# Patient Record
Sex: Female | Born: 1985 | Race: White | Hispanic: No | Marital: Single | Smoking: Current every day smoker
Health system: Southern US, Community
[De-identification: ages and names within clinical notes are randomized; demographics above are authoritative.]

## PROBLEM LIST (undated history)

## (undated) ENCOUNTER — Ambulatory Visit (HOSPITAL_COMMUNITY): Discharge status: Absent without leave | Payer: Medicaid Other

## (undated) DIAGNOSIS — F909 Attention-deficit hyperactivity disorder, unspecified type: Secondary | ICD-10-CM

## (undated) HISTORY — PX: NO PAST SURGERIES: SHX2092

## (undated) HISTORY — DX: Attention-deficit hyperactivity disorder, unspecified type: F90.9

---
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
## 2007-01-04 ENCOUNTER — Inpatient Hospital Stay (HOSPITAL_COMMUNITY): Admission: AD | Admit: 2007-01-04 | Discharge: 2007-01-04 | Payer: Self-pay | Admitting: Obstetrics and Gynecology

## 2007-01-05 ENCOUNTER — Inpatient Hospital Stay (HOSPITAL_COMMUNITY): Admission: RE | Admit: 2007-01-05 | Discharge: 2007-01-07 | Payer: Self-pay | Admitting: Obstetrics and Gynecology

## 2007-02-17 ENCOUNTER — Other Ambulatory Visit: Admission: RE | Admit: 2007-02-17 | Discharge: 2007-02-17 | Payer: Self-pay | Admitting: Obstetrics and Gynecology

## 2007-09-05 ENCOUNTER — Emergency Department (HOSPITAL_COMMUNITY): Admission: EM | Admit: 2007-09-05 | Discharge: 2007-09-05 | Payer: Self-pay | Admitting: Emergency Medicine

## 2008-09-26 ENCOUNTER — Other Ambulatory Visit: Admission: RE | Admit: 2008-09-26 | Discharge: 2008-09-26 | Payer: Self-pay | Admitting: Obstetrics and Gynecology

## 2009-01-08 ENCOUNTER — Emergency Department (HOSPITAL_BASED_OUTPATIENT_CLINIC_OR_DEPARTMENT_OTHER): Admission: EM | Admit: 2009-01-08 | Discharge: 2009-01-08 | Payer: Self-pay | Admitting: Emergency Medicine

## 2009-01-08 ENCOUNTER — Ambulatory Visit: Payer: Self-pay | Admitting: Diagnostic Radiology

## 2009-04-07 IMAGING — CR DG CHEST 1V PORT
1 series · 1 of 1 positions shown · non-contrast
Comparison: No prior studies.

CLINICAL DATA: Backache, fever, cough

PORTABLE CHEST - 1 VIEW

[view not recorded]
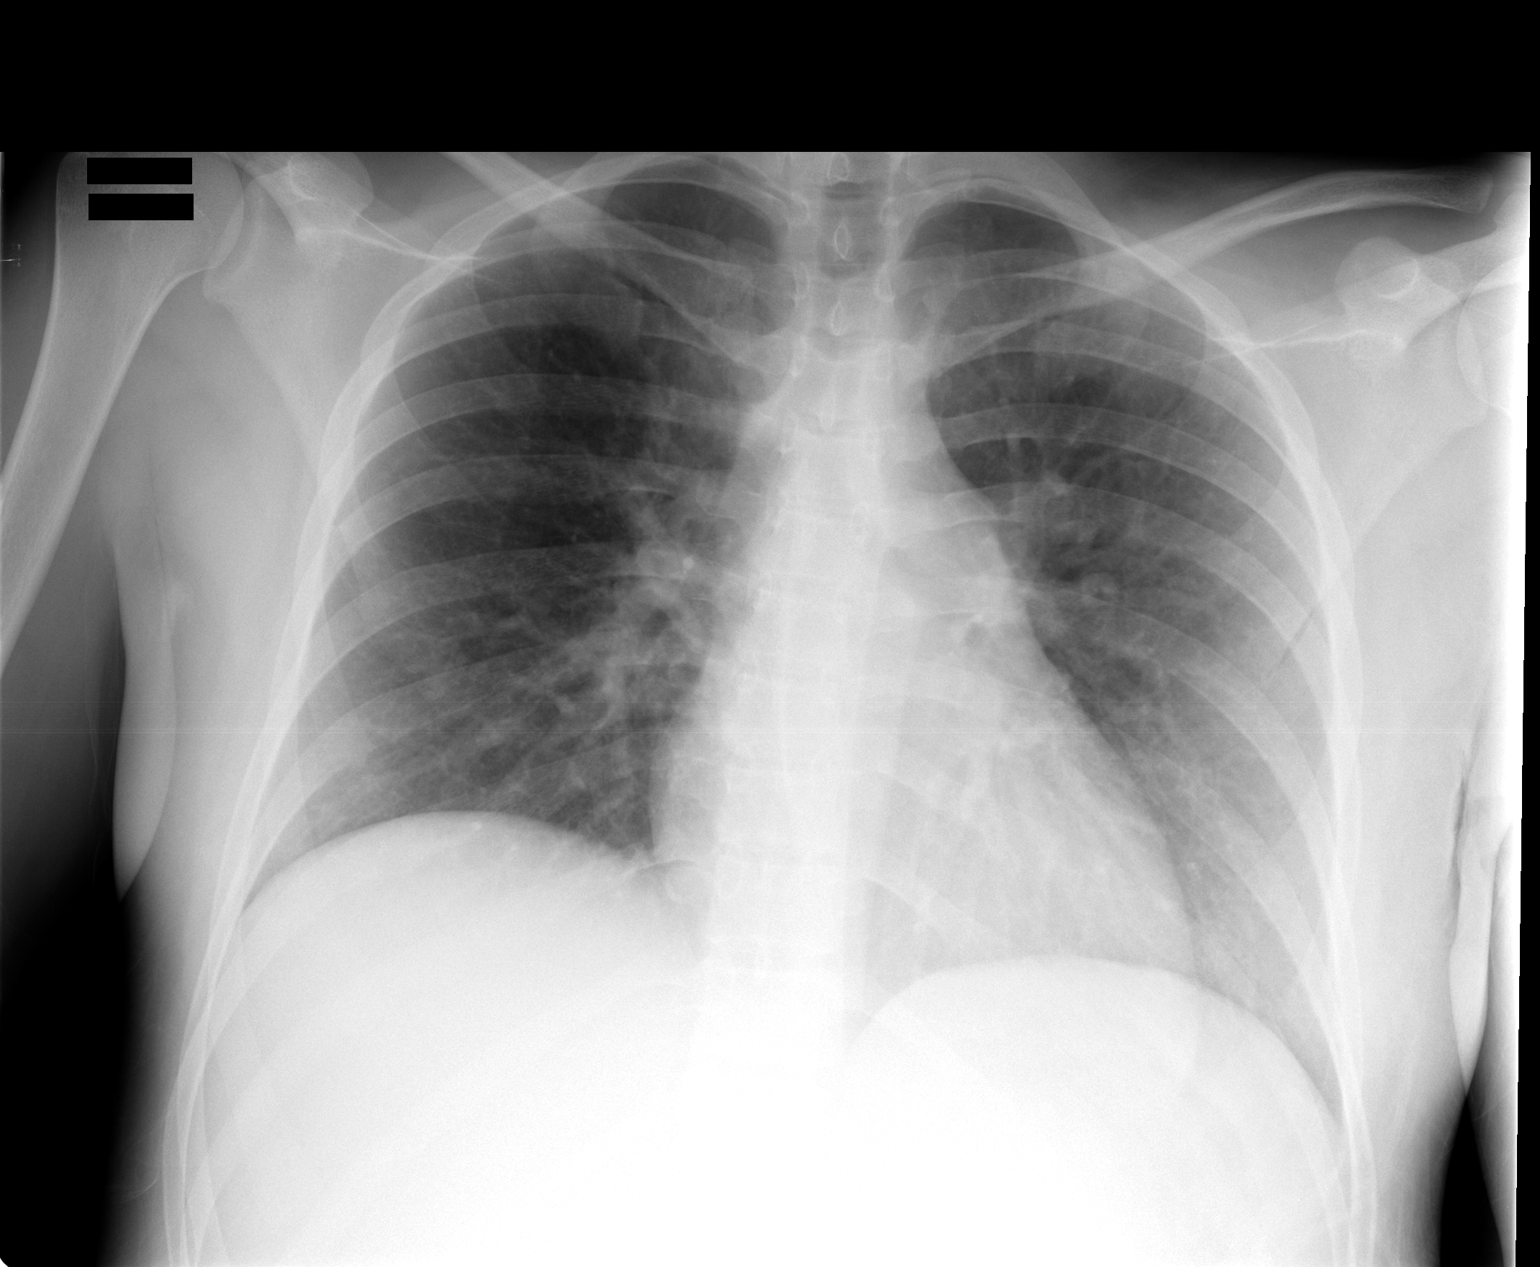

[1 of 1 positions shown; findings below may reference images not displayed]

FINDINGS: There are no acute infiltrates.  The heart and
mediastinal structures are normal for portable study.
IMPRESSION: No evidence for active chest disease.

## 2009-07-05 ENCOUNTER — Emergency Department (HOSPITAL_BASED_OUTPATIENT_CLINIC_OR_DEPARTMENT_OTHER): Admission: EM | Admit: 2009-07-05 | Discharge: 2009-07-05 | Payer: Self-pay | Admitting: Emergency Medicine

## 2009-07-05 ENCOUNTER — Ambulatory Visit: Payer: Self-pay | Admitting: Interventional Radiology

## 2010-06-07 ENCOUNTER — Encounter: Payer: Self-pay | Admitting: Obstetrics and Gynecology

## 2010-08-11 IMAGING — CR DG ABDOMEN ACUTE W/ 1V CHEST
4 series · 4 of 4 positions shown · non-contrast
Comparison: Chest radiograph performed 09/05/2007

CLINICAL DATA: Abdominal pain and vomiting.

ACUTE ABDOMEN SERIES (ABDOMEN 2 VIEW & CHEST 1 VIEW)

[w chest pa]
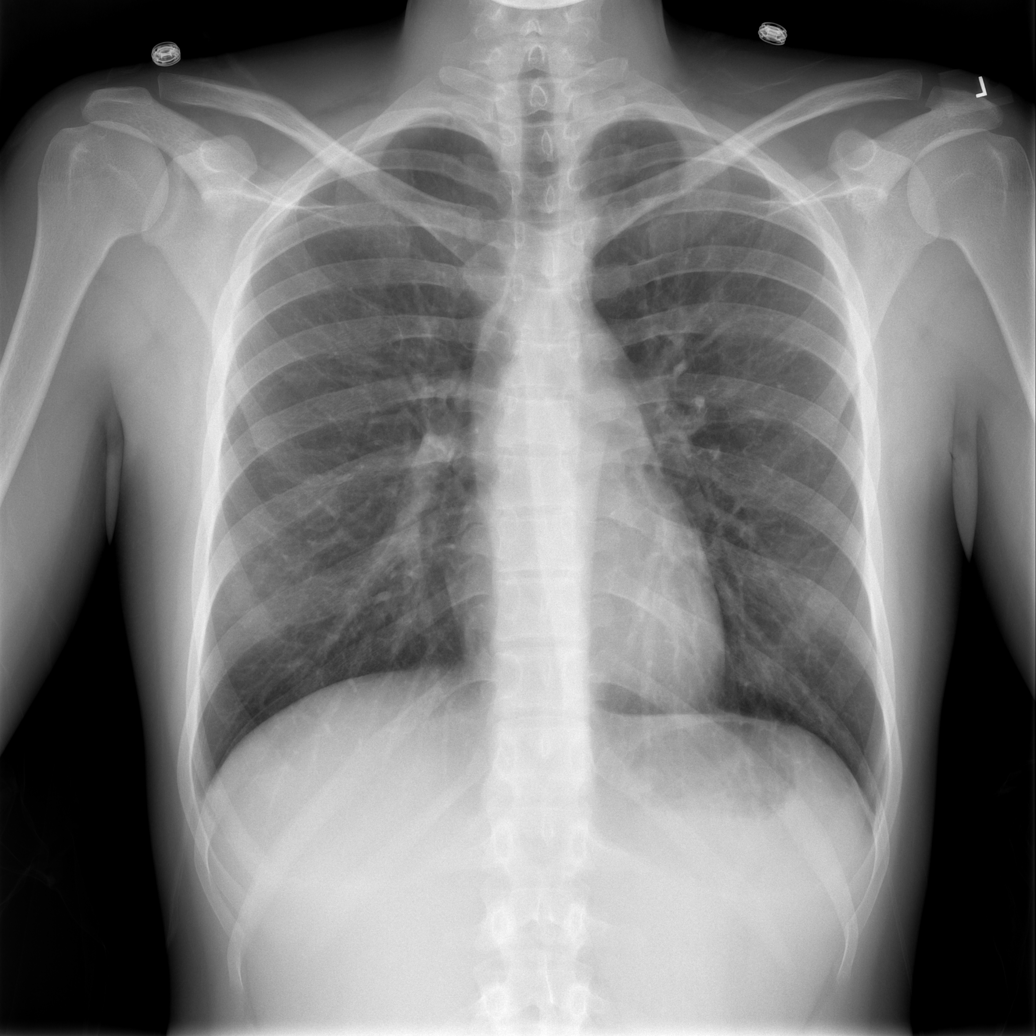

[w abdomen upright]
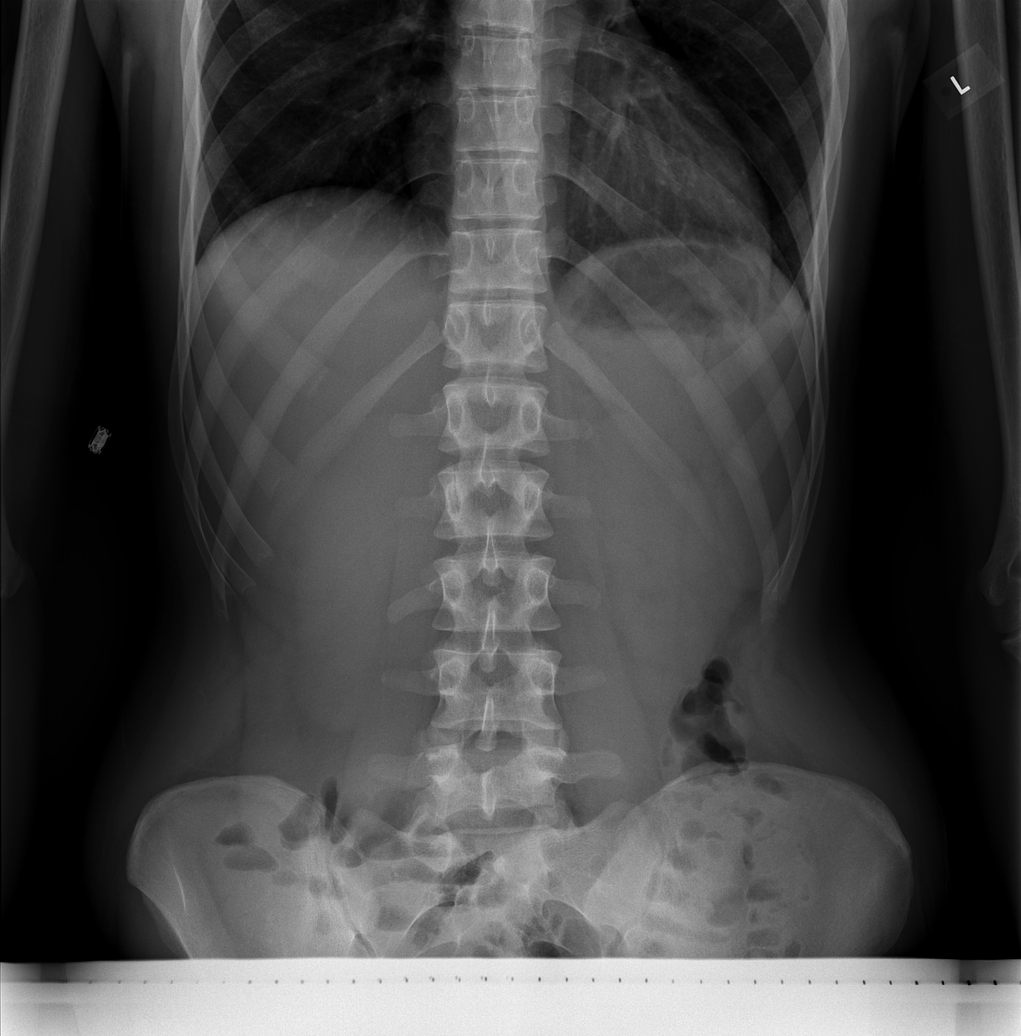

[t abdomen supine (1 of 2)]
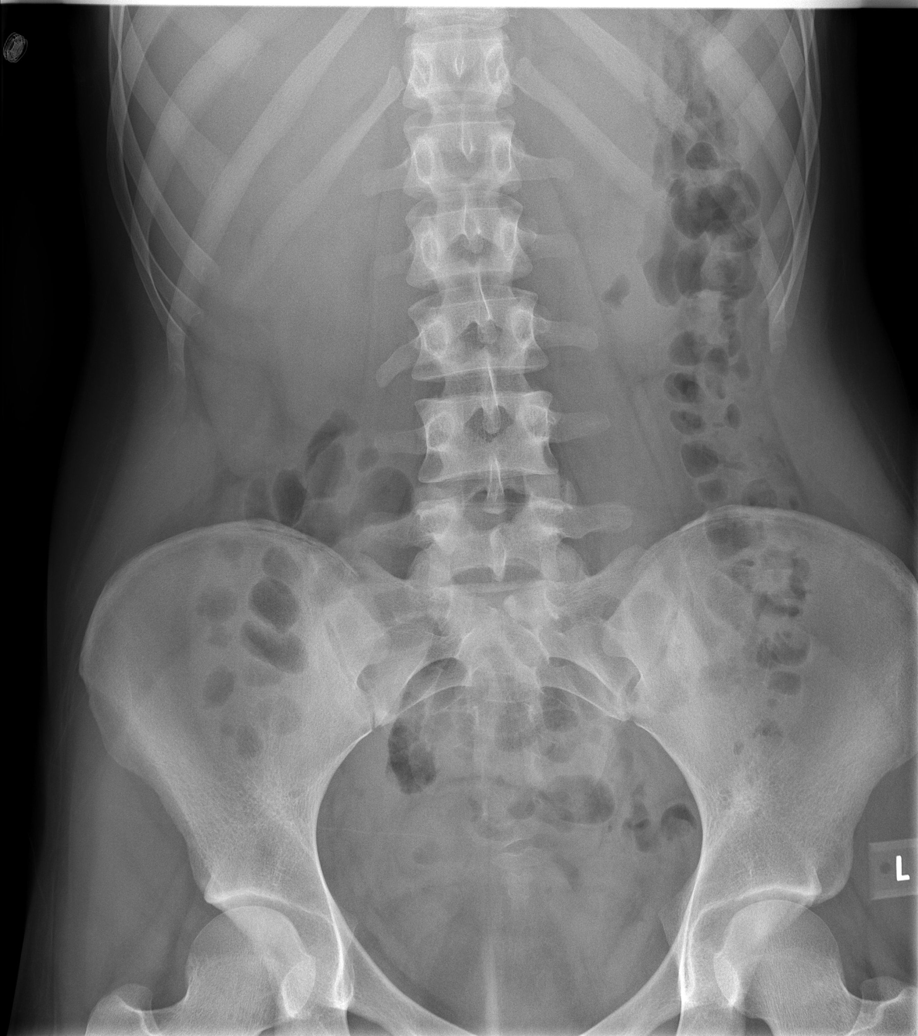

[t abdomen supine (2 of 2)]
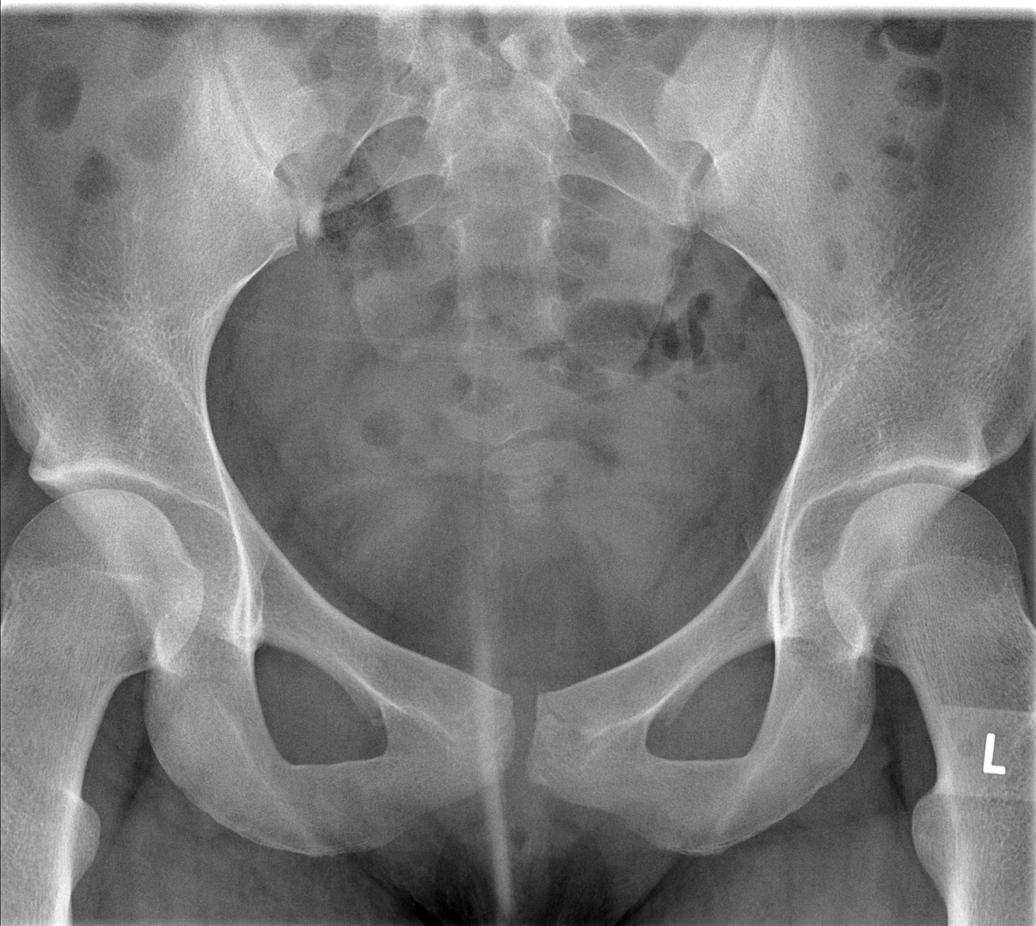

[4 of 4 positions shown; findings below may reference images not displayed]

FINDINGS: The lungs are well-aerated and clear.  There is no
evidence of focal opacification, pleural effusion or pneumothorax.
The cardiomediastinal silhouette is within normal limits.

The visualized bowel gas pattern is unremarkable. Stool and air are
noted within the colon; there is no evidence of small bowel
dilatation to suggest obstruction.  No free intra-abdominal air is
identified on the provided upright view.

No acute osseous abnormalities are seen; the sacroiliac joints are
unremarkable in appearance.
IMPRESSION: No acute cardiopulmonary process seen; unremarkable bowel gas
pattern.  No free intra-abdominal air identified.

## 2010-08-22 LAB — DIFFERENTIAL
Basophils Absolute: 0.1 10*3/uL (ref 0.0–0.1)
Basophils Relative: 2 % — ABNORMAL HIGH (ref 0–1)
Eosinophils Absolute: 0.1 10*3/uL (ref 0.0–0.7)
Lymphocytes Relative: 31 % (ref 12–46)
Monocytes Absolute: 1 10*3/uL (ref 0.1–1.0)

## 2010-08-22 LAB — URINE MICROSCOPIC-ADD ON

## 2010-08-22 LAB — CBC
HCT: 38.3 % (ref 36.0–46.0)
MCHC: 34.7 g/dL (ref 30.0–36.0)
Platelets: 260 10*3/uL (ref 150–400)
RBC: 4.08 MIL/uL (ref 3.87–5.11)
RDW: 12.7 % (ref 11.5–15.5)

## 2010-08-22 LAB — LIPASE, BLOOD: Lipase: 15 U/L — ABNORMAL LOW (ref 23–300)

## 2010-08-22 LAB — COMPREHENSIVE METABOLIC PANEL
Albumin: 4.7 g/dL (ref 3.5–5.2)
GFR calc Af Amer: >60 mL/min (ref >60)

## 2010-08-22 LAB — URINALYSIS, ROUTINE W REFLEX MICROSCOPIC
Nitrite: NEGATIVE
Specific Gravity, Urine: 1.024 (ref 1.005–1.030)
Urobilinogen, UA: 0.2 mg/dL (ref 0.0–1.0)
pH: 6 (ref 5.0–8.0)

## 2010-08-22 LAB — RPR: RPR Ser Ql: NONREACTIVE

## 2010-08-22 LAB — GC/CHLAMYDIA PROBE AMP, GENITAL: GC Probe Amp, Genital: POSITIVE — AB

## 2010-08-22 LAB — WET PREP, GENITAL: Trich, Wet Prep: NONE SEEN

## 2010-10-02 NOTE — Discharge Summary (Signed)
NAME:  Sharon Morgan, Sharon Morgan              ACCOUNT NO.:  [id number]   MEDICAL RECORD NO.:  [id number]          PATIENT TYPE:  INP   LOCATION:  9107                          FACILITY:  [facility]   PHYSICIAN:  James A. Ashley Royalty, M.D.DATE OF BIRTH:  Dec 27, 1985   DATE OF ADMISSION:  01/05/2007  DATE OF DISCHARGE:  01/07/2007                               DISCHARGE SUMMARY   DISCHARGE DIAGNOSES:  1. Intrauterine pregnancy at term, delivered.  2. Term birth living child, vertex.   OPERATIONS AND SPECIAL PROCEDURES:  OB delivery, episiotomy,  episiorrhaphy.   CONSULTATIONS:  None.   DISCHARGE MEDICATIONS:  1. Percocet.  2. Motrin 600 mg.   HISTORY AND PHYSICAL:  This is an 25 year old primigravida at 31 weeks,  6 days gestation.  Prenatal care was essentially uncomplicated.  The  patient presented complaining of contractions.  Cervical examination at  or about 9:20 a.m. revealed 5 cm dilatation, 100% effacement, +1  station, vertex presentation.  For the remainder of the history and  physical, please see chart.   HOSPITAL COURSE:  The patient was admitted to [facility]  [place].  Initial laboratory studies were drawn.  Artificial rupture  of membranes was accomplished which revealed blood-tinged fluid.  The  patient went on to labor and deliver on January 05, 2007.  The infant was  a 6 pound 8 ounce female, Apgars 9 and one minutes, 9 at five minutes.  Sent to the newborn nursery.  Delivery was accomplished by Dr. Sylvester Harder over a midline episiotomy which was repaired without  difficulty.  The patient's postpartum course was benign.  She was  discharged on the second postpartum day, afebrile and in satisfactory  condition.   DISPOSITION:  The patient is to return to [facility] gynecology and  obstetrics in 4-6 weeks for postpartum evaluation.      James A. Ashley Royalty, M.D.  Electronically Signed     JAM/MEDQ  D:  02/02/2007  T:  02/02/2007  Job:  [id number]

## 2011-02-09 LAB — BASIC METABOLIC PANEL
CO2: 23
Calcium: 9
Creatinine, Ser: 0.91
GFR calc non Af Amer: >60

## 2011-02-09 LAB — PREGNANCY, URINE: Preg Test, Ur: NEGATIVE

## 2011-02-09 LAB — CSF CELL COUNT WITH DIFFERENTIAL
RBC Count, CSF: 2 — ABNORMAL HIGH
Tube #: 4
WBC, CSF: 2
WBC, CSF: 2

## 2011-02-09 LAB — URINALYSIS, ROUTINE W REFLEX MICROSCOPIC: pH: 5.5

## 2011-02-09 LAB — CBC
Hemoglobin: 11.1 — ABNORMAL LOW
Platelets: 208
RBC: 3.41 — ABNORMAL LOW
RDW: 12.3

## 2011-02-09 LAB — URINE MICROSCOPIC-ADD ON

## 2011-02-09 LAB — GLUCOSE, CSF: Glucose, CSF: 65

## 2011-02-09 LAB — CSF CULTURE W GRAM STAIN

## 2011-02-26 LAB — CBC
HCT: 31.8 — ABNORMAL LOW
Hemoglobin: 10.9 — ABNORMAL LOW
Hemoglobin: 11.1 — ABNORMAL LOW
MCHC: 34.8
MCHC: 35
MCHC: 35.5
MCV: 97.4
MCV: 99.2 — ABNORMAL HIGH
Platelets: 201
Platelets: 218
Platelets: 271
RBC: 3.82
RDW: 14
RDW: 14
RDW: 14.5 — ABNORMAL HIGH

## 2011-02-26 LAB — TYPE AND SCREEN: ABO/RH(D): O POS

## 2011-02-26 LAB — RPR: RPR Ser Ql: NONREACTIVE

## 2013-03-27 ENCOUNTER — Encounter (HOSPITAL_COMMUNITY): Payer: Self-pay

## 2013-03-27 ENCOUNTER — Inpatient Hospital Stay (HOSPITAL_COMMUNITY)
Admission: AD | Admit: 2013-03-27 | Discharge: 2013-03-27 | Disposition: A | Discharge status: Home / Self Care | Payer: Medicaid Other | Source: Ambulatory Visit | Attending: Obstetrics & Gynecology | Admitting: Obstetrics & Gynecology

## 2013-03-27 DIAGNOSIS — N10 Acute tubulo-interstitial nephritis: Secondary | ICD-10-CM

## 2013-03-27 DIAGNOSIS — R300 Dysuria: Secondary | ICD-10-CM | POA: Insufficient documentation

## 2013-03-27 DIAGNOSIS — R109 Unspecified abdominal pain: Secondary | ICD-10-CM | POA: Insufficient documentation

## 2013-03-27 LAB — CBC
HCT: 34.6 % — ABNORMAL LOW (ref 36.0–46.0)
Hemoglobin: 11.9 g/dL — ABNORMAL LOW (ref 12.0–15.0)
MCHC: 34.4 g/dL (ref 30.0–36.0)
MCV: 93 fL (ref 78.0–100.0)
WBC: 12.5 10*3/uL — ABNORMAL HIGH (ref 4.0–10.5)

## 2013-03-27 LAB — URINE MICROSCOPIC-ADD ON

## 2013-03-27 LAB — POCT PREGNANCY, URINE: Preg Test, Ur: NEGATIVE

## 2013-03-27 LAB — URINALYSIS, ROUTINE W REFLEX MICROSCOPIC
Nitrite: NEGATIVE
Protein, ur: NEGATIVE mg/dL
Specific Gravity, Urine: 1.02 (ref 1.005–1.030)
Urobilinogen, UA: 0.2 mg/dL (ref 0.0–1.0)

## 2013-03-27 MED ORDER — CIPROFLOXACIN HCL 500 MG PO TABS
500.0000 mg | ORAL_TABLET | Freq: Once | ORAL | Status: AC
  Administered 2013-03-27: 500 mg via ORAL
  Filled 2013-03-27: qty 1

## 2013-03-27 MED ORDER — TRAMADOL HCL 50 MG PO TABS: 50.0000 mg | ORAL_TABLET | Freq: Four times a day (QID) | ORAL | Status: DC | PRN

## 2013-03-27 MED ORDER — PHENAZOPYRIDINE HCL 100 MG PO TABS
200.0000 mg | ORAL_TABLET | Freq: Once | ORAL | Status: AC
  Administered 2013-03-27: 200 mg via ORAL
  Filled 2013-03-27: qty 2

## 2013-03-27 MED ORDER — TRAMADOL HCL 50 MG PO TABS
50.0000 mg | ORAL_TABLET | Freq: Once | ORAL | Status: AC
  Administered 2013-03-27: 50 mg via ORAL
  Filled 2013-03-27: qty 1

## 2013-03-27 MED ORDER — PHENAZOPYRIDINE HCL 200 MG PO TABS: 200.0000 mg | ORAL_TABLET | Freq: Three times a day (TID) | ORAL | Status: DC | PRN

## 2013-03-27 MED ORDER — CIPROFLOXACIN HCL 500 MG PO TABS: 500.0000 mg | ORAL_TABLET | Freq: Two times a day (BID) | ORAL | Status: DC

## 2013-03-27 NOTE — MAU Provider Note (Signed)
Chief Complaint: Back Pain   First Provider Initiated Contact with Patient 03/27/13 0620      SUBJECTIVE HPI: Sharon Morgan is a 27 y.o. G54P1001 female who presents with left flank pain, dysuria, frequency and urgency of urination x2 days. Gradual onset of flank pain over past 2 days.  States she thought she had a UTI. Increased water intake and take Tylenol for pain with no relief of symptoms.  Unsure if she has a fever, but started sweating a lot about 2 hours after taking Tylenol yesterday evening.  Has not had any testing done since symptoms started.  History reviewed. No pertinent past medical history. OB History  Gravida Para Term Preterm AB SAB TAB Ectopic Multiple Living  1 1 1       1     # Outcome Date GA Lbr Len/2nd Weight Sex Delivery Anes PTL Lv  1 TRM 2008     SVD   Y     Past Surgical History  Procedure Laterality Date  . No past surgeries     History   Social History  . Marital Status: Single    Spouse Name: N/A    Number of Children: N/A  . Years of Education: N/A   Occupational History  . Not on file.   Social History Main Topics  . Smoking status: Current Every Day Smoker  . Smokeless tobacco: Never Used  . Alcohol Use: No  . Drug Use: No  . Sexual Activity: Yes    Birth Control/ Protection: Pill   Other Topics Concern  . Not on file   Social History Narrative  . No narrative on file   No current facility-administered medications on file prior to encounter.   No current outpatient prescriptions on file prior to encounter.   No Known Allergies  ROS: Pertinent positive items in HPI. Negative for chills, nausea, vomiting, diarrhea, constipation, hematuria, abdominal pain, vaginal bleeding or vaginal discharge.  OBJECTIVE Blood pressure 118/71, pulse 94, temperature 99.5 F (37.5 C), temperature source Oral, resp. rate 18, height 5\' 7"  (1.702 m), weight 76.839 kg (169 lb 6.4 oz), last menstrual period 03/10/2013. GENERAL: Well-developed,  well-nourished female in mild distress.  HEENT: Normocephalic HEART: normal rate RESP: normal effort ABDOMEN: Soft, non-tender. Positive left CVA tenderness. EXTREMITIES: Nontender, no edema NEURO: Alert and oriented SPECULUM EXAM: Deferred  LAB RESULTS Results for orders placed during the hospital encounter of 03/27/13 (from the past 24 hour(s))  URINALYSIS, ROUTINE W REFLEX MICROSCOPIC     Status: Abnormal   Collection Time    03/27/13  5:26 AM      Result Value Range   Color, Urine YELLOW  YELLOW   APPearance CLEAR  CLEAR   Specific Gravity, Urine 1.020  1.005 - 1.030   pH 6.0  5.0 - 8.0   Glucose, UA NEGATIVE  NEGATIVE mg/dL   Hgb urine dipstick SMALL (*) NEGATIVE   Bilirubin Urine NEGATIVE  NEGATIVE   Ketones, ur 40 (*) NEGATIVE mg/dL   Protein, ur NEGATIVE  NEGATIVE mg/dL   Urobilinogen, UA 0.2  0.0 - 1.0 mg/dL   Nitrite NEGATIVE  NEGATIVE   Leukocytes, UA SMALL (*) NEGATIVE  URINE MICROSCOPIC-ADD ON     Status: None   Collection Time    03/27/13  5:26 AM      Result Value Range   Squamous Epithelial / LPF RARE  RARE   WBC, UA 3-6  <3 WBC/hpf   RBC / HPF 0-2  <3 RBC/hpf  Bacteria, UA RARE  RARE  POCT PREGNANCY, URINE     Status: None   Collection Time    03/27/13  5:34 AM      Result Value Range   Preg Test, Ur NEGATIVE  NEGATIVE  CBC     Status: Abnormal   Collection Time    03/27/13  6:22 AM      Result Value Range   WBC 12.5 (*) 4.0 - 10.5 K/uL   RBC 3.72 (*) 3.87 - 5.11 MIL/uL   Hemoglobin 11.9 (*) 12.0 - 15.0 g/dL   HCT 16.1 (*) 09.6 - 04.5 %   MCV 93.0  78.0 - 100.0 fL   MCH 32.0  26.0 - 34.0 pg   MCHC 34.4  30.0 - 36.0 g/dL   RDW 40.9  81.1 - 91.4 %   Platelets 194  150 - 400 K/uL    IMAGING No results found.  MAU COURSE CBC, Cipro, Pyridium and tramadol ordered.  ASSESSMENT 1. Acute pyelonephritis    PLAN Discharge home in stable condition. Increase fluids and rest. Work note Given. Urine culture pending.  informed patient that she  would need hospitalization for IV antibiotics if unable to keep down oral medications or symptoms do not improve with outpatient treatment.   Follow-up Information   Follow up with Primary care provider In 1 week.      Follow up with [facility]. (As needed if symptoms worsen or if unable to keep down medications)    Contact information:   [address] [place] [address]        Medication List         ciprofloxacin 500 MG tablet  Commonly known as:  CIPRO  Take 1 tablet (500 mg total) by mouth 2 (two) times daily.     ibuprofen 800 MG tablet  Commonly known as:  ADVIL,MOTRIN  Take 800 mg by mouth every 8 (eight) hours as needed.     norethindrone-ethinyl estradiol 0.5-35 MG-MCG tablet  Commonly known as:  NECON,BREVICON,MODICON  Take 1 tablet by mouth daily.     phenazopyridine 200 MG tablet  Commonly known as:  PYRIDIUM  Take 1 tablet (200 mg total) by mouth 3 (three) times daily as needed for pain.     traMADol 50 MG tablet  Commonly known as:  ULTRAM  Take 1-2 tablets (50-100 mg total) by mouth every 6 (six) hours as needed.       [place], CNM 03/27/2013  6:02 AM

## 2013-03-27 NOTE — MAU Note (Signed)
Constant left mid back pain since 8pm. Denies vaginal bleeding or discharge. Denies n/v/d. Burning with urination x 2 days.

## 2013-03-29 LAB — URINE CULTURE: Culture: 25000

## 2013-09-19 ENCOUNTER — Ambulatory Visit (INDEPENDENT_AMBULATORY_CARE_PROVIDER_SITE_OTHER): Payer: Medicaid Other | Admitting: Obstetrics

## 2013-09-19 ENCOUNTER — Encounter: Payer: Self-pay | Admitting: Obstetrics

## 2013-09-19 VITALS — BP 122/65 | HR 75 | Temp 98.8°F | Ht 69.0 in | Wt 168.0 lb

## 2013-09-19 DIAGNOSIS — Z0000 Encounter for general adult medical examination without abnormal findings: Secondary | ICD-10-CM

## 2013-09-19 DIAGNOSIS — N939 Abnormal uterine and vaginal bleeding, unspecified: Secondary | ICD-10-CM

## 2013-09-19 DIAGNOSIS — Z3041 Encounter for surveillance of contraceptive pills: Secondary | ICD-10-CM

## 2013-09-19 DIAGNOSIS — N926 Irregular menstruation, unspecified: Secondary | ICD-10-CM

## 2013-09-19 LAB — POCT URINALYSIS DIPSTICK
Blood, UA: 250
GLUCOSE UA: NEGATIVE
KETONES UA: NEGATIVE
LEUKOCYTES UA: NEGATIVE
Nitrite, UA: NEGATIVE
PH UA: 5
Spec Grav, UA: 1.02

## 2013-09-19 LAB — POCT URINE PREGNANCY: Preg Test, Ur: NEGATIVE

## 2013-09-19 MED ORDER — NORETHINDRONE-ETH ESTRADIOL 1-35 MG-MCG PO TABS: 1.0000 | ORAL_TABLET | Freq: Every day | ORAL | Status: DC

## 2013-09-19 NOTE — Progress Notes (Signed)
Subjective:     Sharon Morgan is a 28 y.o. female here for a routine ex am.  Current complaints: Prolonged period on new OCP.    Personal health questionnaire:  Is patient Ashkenazi Jewish, have a family history of breast and/or ovarian cancer: no Is there a family history of uterine cancer diagnosed at age < 7150, gastrointestinal cancer, urinary tract cancer, family member who is a Personnel officerLynch syndrome-associated carrier: no Is the patient overweight and hypertensive, family history of diabetes, personal history of gestational diabetes or PCOS: no Is patient over 6955, have PCOS,  family history of premature CHD under age 26, diabetes, smoke, have hypertension or peripheral artery disease:  no  The HPI was reviewed and explored in further detail by the provider. Gynecologic History Patient's last menstrual period was 08/27/2013. Contraception: OCP (estrogen/progesterone) Last Pap: 2 years ago. Results were: normal Last mammogram: n/a. Results were: n/a  Obstetric History OB History  Gravida Para Term Preterm AB SAB TAB Ectopic Multiple Living  1 1 1       1     # Outcome Date GA Lbr Len/2nd Weight Sex Delivery Anes PTL Lv  1 TRM 2008     SVD   Y      Past Medical History  Diagnosis Date  . ADHD (attention deficit hyperactivity disorder)     Past Surgical History  Procedure Laterality Date  . No past surgeries      Current outpatient prescriptions:amphetamine-dextroamphetamine (ADDERALL) 20 MG tablet, Take 20 mg by mouth 2 (two) times daily., Disp: , Rfl: ;  ibuprofen (ADVIL,MOTRIN) 800 MG tablet, Take 800 mg by mouth every 8 (eight) hours as needed., Disp: , Rfl: ;  Norgestimate-Ethinyl Estradiol Triphasic (TRINESSA, 28,) 0.18/0.215/0.25 MG-35 MCG tablet, Take 1 tablet by mouth daily., Disp: , Rfl:  norethindrone-ethinyl estradiol 1/35 (NECON 1/35, 28,) tablet, Take 1 tablet by mouth daily., Disp: 1 Package, Rfl: 1 No Known Allergies  History  Substance Use Topics  . Smoking status:  Former Games developermoker  . Smokeless tobacco: Never Used  . Alcohol Use: Yes     Comment: week ends    Family History  Problem Relation Age of Onset  . Cancer Mother   . Hypertension Father       Review of Systems  Constitutional: negative for fatigue and weight loss Respiratory: negative for cough and wheezing Cardiovascular: negative for chest pain, fatigue and palpitations Gastrointestinal: negative for abdominal pain and change in bowel habits Musculoskeletal:negative for myalgias Neurological: negative for gait problems and tremors Behavioral/Psych: negative for abusive relationship, depression Endocrine: negative for temperature intolerance   Genitourinary:negative for abnormal menstrual periods, genital lesions, hot flashes, sexual problems and vaginal discharge Integument/breast: negative for breast lump, breast tenderness, nipple discharge and skin lesion(s)    Objective:       General:   alert  Skin:   no rash or abnormalities  Lungs:   clear to auscultation bilaterally  Heart:   regular rate and rhythm, S1, S2 normal, no murmur, click, rub or gallop  Breasts:   normal without suspicious masses, skin or nipple changes or axillary nodes  Abdomen:  normal findings: no organomegaly, soft, non-tender and no hernia  Pelvis:  External genitalia: normal general appearance Urinary system: urethral meatus normal and bladder without fullness, nontender Vaginal: normal without tenderness, induration or masses Cervix: normal appearance Adnexa: normal bimanual exam Uterus: anteverted and non-tender, normal size   Lab Review Urine pregnancy test Labs reviewed yes Radiologic studies reviewed no  Assessment:    Healthy female exam.   AUB.  Probably related to new pill.   Plan:    Education reviewed: safe sex/STD prevention and OCP management. Contraception: OCP (estrogen/progesterone). Follow up in: several months. Necon 1/35 Rx.  Discontinue Imelda Pillowrinessa.   Meds ordered  this encounter  Medications  . amphetamine-dextroamphetamine (ADDERALL) 20 MG tablet    Sig: Take 20 mg by mouth 2 (two) times daily.  . Norgestimate-Ethinyl Estradiol Triphasic (TRINESSA, 28,) 0.18/0.215/0.25 MG-35 MCG tablet    Sig: Take 1 tablet by mouth daily.  . norethindrone-ethinyl estradiol 1/35 (NECON 1/35, 28,) tablet    Sig: Take 1 tablet by mouth daily.    Dispense:  1 Package    Refill:  1   Orders Placed This Encounter  Procedures  . WET PREP BY MOLECULAR PROBE  . GC/Chlamydia Probe Amp  . POCT urine pregnancy  . POCT urinalysis dipstick   Need to obtain previous records Possible management options include:Either continuing new OCP or going back to previous OCP that worked well. Follow up as needed.

## 2013-09-20 LAB — GC/CHLAMYDIA PROBE AMP
CT Probe RNA: NEGATIVE
GC PROBE AMP APTIMA: NEGATIVE

## 2013-09-20 LAB — WET PREP BY MOLECULAR PROBE
Candida species: NEGATIVE
Gardnerella vaginalis: NEGATIVE
Trichomonas vaginosis: NEGATIVE

## 2013-09-20 LAB — PAP IG W/ RFLX HPV ASCU

## 2013-10-04 ENCOUNTER — Ambulatory Visit: Payer: Medicaid Other | Admitting: Obstetrics

## 2013-11-05 ENCOUNTER — Other Ambulatory Visit: Payer: Self-pay | Admitting: Obstetrics

## 2014-02-05 ENCOUNTER — Other Ambulatory Visit: Payer: Self-pay | Admitting: Obstetrics

## 2014-02-05 NOTE — Telephone Encounter (Signed)
Please advise 

## 2014-03-18 ENCOUNTER — Encounter: Payer: Self-pay | Admitting: Obstetrics

## 2014-05-29 ENCOUNTER — Other Ambulatory Visit: Payer: Self-pay | Admitting: Obstetrics

## 2014-05-31 NOTE — Telephone Encounter (Signed)
Please advise 

## 2014-09-23 ENCOUNTER — Ambulatory Visit: Payer: Medicaid Other | Admitting: Obstetrics

## 2014-11-23 ENCOUNTER — Encounter (HOSPITAL_COMMUNITY): Payer: Self-pay

## 2014-11-23 ENCOUNTER — Inpatient Hospital Stay (HOSPITAL_COMMUNITY)
Admission: AD | Admit: 2014-11-23 | Discharge: 2014-11-23 | Disposition: A | Discharge status: Home / Self Care | Payer: Medicaid Other | Source: Ambulatory Visit | Attending: Obstetrics | Admitting: Obstetrics

## 2014-11-23 DIAGNOSIS — R300 Dysuria: Secondary | ICD-10-CM | POA: Diagnosis present

## 2014-11-23 DIAGNOSIS — N390 Urinary tract infection, site not specified: Secondary | ICD-10-CM | POA: Diagnosis not present

## 2014-11-23 DIAGNOSIS — Z87891 Personal history of nicotine dependence: Secondary | ICD-10-CM | POA: Diagnosis not present

## 2014-11-23 LAB — URINALYSIS, ROUTINE W REFLEX MICROSCOPIC
Bilirubin Urine: NEGATIVE
Glucose, UA: NEGATIVE mg/dL
Ketones, ur: NEGATIVE mg/dL
NITRITE: NEGATIVE
Protein, ur: NEGATIVE mg/dL
SPECIFIC GRAVITY, URINE: 1.01 (ref 1.005–1.030)
UROBILINOGEN UA: 1 mg/dL (ref 0.0–1.0)
pH: 7 (ref 5.0–8.0)

## 2014-11-23 LAB — URINE MICROSCOPIC-ADD ON

## 2014-11-23 LAB — POCT PREGNANCY, URINE: PREG TEST UR: NEGATIVE

## 2014-11-23 MED ORDER — CEPHALEXIN 250 MG PO CAPS: 250.0000 mg | ORAL_CAPSULE | Freq: Four times a day (QID) | ORAL | Status: DC

## 2014-11-23 NOTE — MAU Note (Signed)
Pt left AMA without notifying nurse or NP.

## 2014-11-23 NOTE — MAU Note (Signed)
Attempted to call pt regarding UA results. Message left

## 2014-11-23 NOTE — Discharge Instructions (Signed)
Urinary Tract Infection A urinary tract infection (UTI) can occur any place along the urinary tract. The tract includes the kidneys, ureters, bladder, and urethra. A type of germ called bacteria often causes a UTI. UTIs are often helped with antibiotic medicine.  HOME CARE   If given, take antibiotics as told by your doctor. Finish them even if you start to feel better.  Drink enough fluids to keep your pee (urine) clear or pale yellow.  Avoid tea, drinks with caffeine, and bubbly (carbonated) drinks.  Pee often. Avoid holding your pee in for a long time.  Pee before and after having sex (intercourse).  Wipe from front to back after you poop (bowel movement) if you are a woman. Use each tissue only once. GET HELP RIGHT AWAY IF:   You have back pain.  You hav    e lower belly (abdominal) pain.  You have chills.  You feel sick to your stomach (nauseous).  You throw up (vomit).  Your burning or discomfort with peeing does not go away.  You have a fever.  Your symptoms are not better in 3 days. MAKE SURE YOU:   Understand these instructions.  Will watch your condition.  Will get help right away if you are not doing well or get worse. Document Released: 10/20/2007 Document Revised: 01/26/2012 Document Reviewed: 12/02/2011 [facility] Patient Information 2015 [employer]. This information is not intended to replace advice given to you by your health care provider. Make sure you discuss any questions you have with your health care provider.  PATIENT LEFT BEFORE UA BACK-D/C instructions not given

## 2014-11-23 NOTE — MAU Note (Signed)
Pt presents complaining of flank pain that started a week ago that is on and off. States she needs antibiotics because she's in pain. Pt also having pain with urination and frequency.

## 2014-11-23 NOTE — MAU Provider Note (Signed)
History     CSN: [id number]  Arrival date and time: 11/23/14 1408   None     Chief Complaint  Patient presents with  . Flank Pain   HPI Sharon Morgan is 29 y.o. G1P1001 Unknown weeks presenting with urinary frequency, dysuria and flank pain that began 1 week ago.  She is patient of Dr. [facility].  Hx of bladder infections.  Neg for fever, chills.  She is not currently sexually active.    Past Medical History  Diagnosis Date  . ADHD (attention deficit hyperactivity disorder)     Past Surgical History  Procedure Laterality Date  . No past surgeries      Family History  Problem Relation Age of Onset  . Cancer Mother   . Hypertension Father     History  Substance Use Topics  . Smoking status: Former Games developermoker  . Smokeless tobacco: Never Used  . Alcohol Use: Yes     Comment: week ends    Allergies: No Known Allergies  No prescriptions prior to admission    Review of Systems  Constitutional: Negative for fever and chills.  Gastrointestinal: Negative for nausea, vomiting and abdominal pain.  Genitourinary: Negative for dysuria and frequency.       + for right mid back pain  Neurological: Negative for headaches.   Physical Exam   Blood pressure 123/77, pulse 61, temperature 98.4 F (36.9 C), temperature source Oral, resp. rate 18, last menstrual period 10/30/2014.  Physical Exam  Constitutional: She is oriented to person, place, and time. She appears well-developed and well-nourished. No distress.  Cardiovascular: Normal rate.   Respiratory: Effort normal.  GI: There is CVA tenderness (mild right flank pain).  Neurological: She is alert and oriented to person, place, and time.  Skin: Skin is warm and dry.  Psychiatric: She has a normal mood and affect. Her behavior is normal.    Results for orders placed or performed during the hospital encounter of 11/23/14 (from the past 24 hour(s))  Urinalysis, Routine w reflex microscopic ([facility])     Status:  Abnormal   Collection Time: 11/23/14  2:25 PM  Result Value Ref Range   Color, Urine YELLOW YELLOW   APPearance CLEAR CLEAR   Specific Gravity, Urine 1.010 1.005 - 1.030   pH 7.0 5.0 - 8.0   Glucose, UA NEGATIVE NEGATIVE mg/dL   Hgb urine dipstick SMALL (A) NEGATIVE   Bilirubin Urine NEGATIVE NEGATIVE   Ketones, ur NEGATIVE NEGATIVE mg/dL   Protein, ur NEGATIVE NEGATIVE mg/dL   Urobilinogen, UA 1.0 0.0 - 1.0 mg/dL   Nitrite NEGATIVE NEGATIVE   Leukocytes, UA SMALL (A) NEGATIVE  Urine microscopic-add on     Status: Abnormal   Collection Time: 11/23/14  2:25 PM  Result Value Ref Range   Squamous Epithelial / LPF FEW (A) RARE   WBC, UA 21-50 <3 WBC/hpf   RBC / HPF 0-2 <3 RBC/hpf   Bacteria, UA MANY (A) RARE  Pregnancy, urine POC     Status: None   Collection Time: 11/23/14  2:30 PM  Result Value Ref Range   Preg Test, Ur NEGATIVE NEGATIVE   MAU Course  Procedures  MDM MSE Labs Exam Reported patient left without telling anyone.  She is not in the room at the time I got UA results. Rayfield Citizenaroline, RN called her and left message to call us back for results.  No answer.  She called back 10 minutes later, results given and Rx will be sent  to pharmacy  Assessment and Plan  A:  Acute UTI  P:  Rx to pharmacy      Increase po fluids      Follow up with Dr. Clearance Coots if sxs persist after treatment.  KEY,EVE M 11/23/2014, 3:44 PM

## 2017-01-19 ENCOUNTER — Ambulatory Visit: Payer: Self-pay | Admitting: Physician Assistant

## 2017-08-16 ENCOUNTER — Encounter: Payer: Self-pay | Admitting: Physician Assistant

## 2017-08-29 ENCOUNTER — Encounter (HOSPITAL_COMMUNITY): Payer: Self-pay | Admitting: Emergency Medicine

## 2017-08-29 ENCOUNTER — Emergency Department (HOSPITAL_COMMUNITY)
Admission: EM | Admit: 2017-08-29 | Discharge: 2017-08-29 | Disposition: A | Discharge status: Home / Self Care | Payer: Self-pay | Attending: Physician Assistant | Admitting: Physician Assistant

## 2017-08-29 ENCOUNTER — Emergency Department (HOSPITAL_COMMUNITY): Payer: Self-pay

## 2017-08-29 DIAGNOSIS — M25562 Pain in left knee: Secondary | ICD-10-CM | POA: Insufficient documentation

## 2017-08-29 DIAGNOSIS — Z79899 Other long term (current) drug therapy: Secondary | ICD-10-CM | POA: Insufficient documentation

## 2017-08-29 DIAGNOSIS — Z87891 Personal history of nicotine dependence: Secondary | ICD-10-CM | POA: Insufficient documentation

## 2017-08-29 DIAGNOSIS — F909 Attention-deficit hyperactivity disorder, unspecified type: Secondary | ICD-10-CM | POA: Insufficient documentation

## 2017-08-29 NOTE — Progress Notes (Signed)
Orthopedic Tech Progress Note Patient Details:  Sharon Morgan [birth year] [id number]  Ortho Devices Type of Ortho Device: Knee Immobilizer, Crutches Ortho Device/Splint Interventions: Application   Post Interventions Patient Tolerated: Well Instructions Provided: Care of device   Sharon Morgan [name] 08/29/2017, 9:44 AM

## 2017-08-29 NOTE — ED Triage Notes (Signed)
Pt states on Thursday she was standing in kitchen and felt like her left knee gave out and has been having pain ever since. History of knee dislocation. Pt states unable to put full pressure on knee when walking.

## 2017-08-29 NOTE — Discharge Instructions (Signed)
Please read instructions below. Wear the knee brace at all times until you have followed up with your orthopedic specialist. Take ibuprofen every 6 hours for pain. Elevate your leg when possible.  Apply ice for 20 minutes at a time. Schedule an appointment with your orthopedic specialist.

## 2017-08-29 NOTE — ED Provider Notes (Signed)
[facility] Provider Note   CSN: [id number] Arrival date & time: 08/29/17  0813     History   Chief Complaint Chief Complaint  Patient presents with  . Knee Pain    HPI Sharon Morgan is a 32 y.o. female presenting to the ED with acute onset of left knee pain that began on Thursday.  Patient states she is in the kitchen and pivoted wrong, and has had pain in the lateral aspect of her knee since that time.  She states she has pain with ambulation and feels as though her knee is going to give out when she bears weight.  Has been taking ibuprofen with relief of symptoms.  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  Numbness or tingling, or any other complaints.  [facility]  The history is provided by the patient.    Past Medical History:  Diagnosis Date  . ADHD (attention deficit hyperactivity disorder)     There are no active problems to display for this patient.   Past Surgical History:  Procedure Laterality Date  . NO PAST SURGERIES       OB History    Gravida  1   Para  1   Term  1   Preterm      AB      Living  1     SAB      TAB      Ectopic      Multiple      Live Births  1            Home Medications    Prior to Admission medications   Medication Sig Start Date End Date Taking? Authorizing Provider  acetaminophen (TYLENOL) 500 MG tablet Take 500 mg by mouth every 6 (six) hours as needed.    [provider]  cephALEXin (KEFLEX) 250 MG capsule Take 1 capsule (250 mg total) by mouth 4 (four) times daily. 11/23/14   Elta GuadeloupeKey, [name], NP  NECON 1/35, 28, tablet TAKE 1 TABLET BY MOUTH EVERY DAY Patient not taking: Reported on 11/23/2014 06/03/14   Brock BadHarper, Charles A, MD  Norgestimate-Ethinyl Estradiol Triphasic (TRINESSA, 28,) 0.18/0.215/0.25 MG-35 MCG  tablet Take 1 tablet by mouth daily.    [provider]    Family History Family History  Problem Relation Age of Onset  . Cancer Mother   . Hypertension Father     Social History Social History   Tobacco Use  . Smoking status: Former Games developermoker  . Smokeless tobacco: Never Used  Substance Use Topics  . Alcohol use: Yes    Comment: week ends  . Drug use: No     Allergies   Patient has no known allergies.   Review of Systems Review of Systems  Constitutional: Negative for fever.  Musculoskeletal: Positive for arthralgias.     Physical Exam Updated Vital Signs BP (!) 134/94 (BP Location: Right Arm)   Pulse 72   Temp 98 F (36.7 C) (Oral)   Resp 16   SpO2 100%   Physical Exam  Constitutional: She appears well-developed and well-nourished. No distress.  HENT:  Head: Normocephalic and atraumatic.  Eyes: Conjunctivae are normal.  Cardiovascular: Normal rate and intact distal pulses.  Pulmonary/Chest: Effort normal.  Musculoskeletal:  Left knee without erythema, edema, or warmth.  TTP over lateral joint line.  Normal range of motion, no crepitus.  Negative anterior and posterior drawer, negative valgus and varus.  Normal gait, however causes pain.  Psychiatric: She has a normal mood and affect. Her behavior is normal.  Nursing note and vitals reviewed.    ED Treatments / Results  Labs (all labs ordered are listed, but only abnormal results are displayed) Labs Reviewed - No data to display  EKG None  Radiology Dg Knee Complete 4 Views Left  Result Date: 08/29/2017 CLINICAL DATA:  Acute left knee pain. EXAM: LEFT KNEE - COMPLETE 4+ VIEW COMPARISON:  Radiographs of July 05, 2009. FINDINGS: No evidence of fracture, dislocation, or joint effusion. No evidence of arthropathy or other focal bone abnormality. Soft tissues are unremarkable. IMPRESSION: Normal left knee. Electronically Signed   By: Lupita Raider, M.D.   On: 08/29/2017 08:39     Procedures Procedures (including critical care time)  Medications Ordered in ED Medications - No data to display   Initial Impression / Assessment and Plan / ED Course  I have reviewed the triage vital signs and the nursing notes.  Pertinent labs & imaging results that were available during my care of the patient were reviewed by me and considered in my medical decision making (see chart for details).     Patient with left knee pain after pivoting on Thursday.  No swelling, erythema, or warmth, no concern for septic joint.  Knee is stable on exam.  X-Ray negative.  Given mechanism of injury, cannot rule out internal derangement.  Knee immobilizer provided in the ED, along with crutches.  Instructed patient to follow-up with her orthopedic specialist. Conservative therapy recommended and discussed.  Safe for discharge.  Discussed results, findings, treatment and follow up. Patient advised of return precautions. Patient verbalized understanding and agreed with plan.   Final Clinical Impressions(s) / ED Diagnoses   Final diagnoses:  Acute pain of left knee    ED Discharge Orders    None       Robinson, [place] N, PA-C 08/29/17 1610    Abelino Derrick, MD 08/29/17 1538

## 2017-08-29 NOTE — ED Notes (Signed)
Spoke with ortho who will applied knee immobilizer and crutches.

## 2017-08-29 NOTE — ED Notes (Signed)
Ortho at bedside.

## 2018-11-03 ENCOUNTER — Encounter (HOSPITAL_COMMUNITY): Payer: Self-pay | Admitting: *Deleted

## 2018-11-03 ENCOUNTER — Ambulatory Visit (INDEPENDENT_AMBULATORY_CARE_PROVIDER_SITE_OTHER): Payer: Self-pay

## 2018-11-03 ENCOUNTER — Other Ambulatory Visit: Payer: Self-pay

## 2018-11-03 ENCOUNTER — Ambulatory Visit (HOSPITAL_COMMUNITY)
Admission: EM | Admit: 2018-11-03 | Discharge: 2018-11-03 | Disposition: A | Discharge status: Home / Self Care | Payer: Self-pay | Attending: Internal Medicine | Admitting: Internal Medicine

## 2018-11-03 DIAGNOSIS — S93411A Sprain of calcaneofibular ligament of right ankle, initial encounter: Secondary | ICD-10-CM

## 2018-11-03 MED ORDER — IBUPROFEN 600 MG PO TABS: 600.0000 mg | ORAL_TABLET | Freq: Four times a day (QID) | ORAL | 0 refills | Status: AC | PRN

## 2018-11-03 NOTE — ED Triage Notes (Signed)
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  C/O right lateral ankle pain and swelling.  Difficulty bearing weight.

## 2018-11-06 ENCOUNTER — Telehealth (HOSPITAL_COMMUNITY): Payer: Self-pay | Admitting: Internal Medicine

## 2018-11-06 NOTE — Telephone Encounter (Signed)
Final read on x-ray shows avulsion type fracture involving the distal tip of the lateral malleolus.  I called the patient on the phone number on record [phone].  Message was left for the patient.  Patient will need follow-up with orthopedic surgery.

## 2018-11-06 NOTE — ED Provider Notes (Signed)
[place]    CSN: [id number] Arrival date & time: 11/03/18  1305     History   Chief Complaint Chief Complaint  Patient presents with  . Ankle Injury    HPI Sharon Morgan is a 33 y.o. female with history of ADHD with a history of ADHD comes to urgent care with complaints of right ankle pain with swelling.  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  She instantly developed pain in the right ankle.  Pain is constant and severe.  Pain is aggravated by bearing weight.  She is tried over-the-counter pain medication with no relief.  She denies any numbness or tingling.  She comes to urgent care to be evaluated.Marland Kitchen   HPI  Past Medical History:  Diagnosis Date  . ADHD (attention deficit hyperactivity disorder)     There are no active problems to display for this patient.   Past Surgical History:  Procedure Laterality Date  . NO PAST SURGERIES      OB History    Gravida  1   Para  1   Term  1   Preterm      AB      Living  1     SAB      TAB      Ectopic      Multiple      Live Births  1            Home Medications    Prior to Admission medications   Medication Sig Start Date End Date Taking? Authorizing Provider  ibuprofen (ADVIL) 600 MG tablet Take 1 tablet (600 mg total) by mouth every 6 (six) hours as needed. 11/03/18   LampteyMyrene Galas, MD  Norgestimate-Ethinyl Estradiol Triphasic (TRINESSA, 28,) 0.18/0.215/0.25 MG-35 MCG tablet Take 1 tablet by mouth daily.  11/03/18  [provider]    Family History Family History  Problem Relation Age of Onset  . Cancer Mother   . Hypertension Father     Social History Social History   Tobacco Use  . Smoking status: Current Every Day Smoker  . Smokeless tobacco: Never Used  Substance Use Topics  . Alcohol use: Yes    Comment: occasional  . Drug use: No     Allergies   Patient has no known allergies.   Review of Systems  Review of Systems  Constitutional: Negative for activity change, appetite change, chills and diaphoresis.  HENT: Negative.   Respiratory: Negative.   Cardiovascular: Negative.   Genitourinary: Negative.   Musculoskeletal: Positive for arthralgias, gait problem and joint swelling. Negative for back pain and myalgias.  Skin: Negative.   Neurological: Negative for dizziness, tremors, weakness and numbness.     Physical Exam Triage Vital Signs ED Triage Vitals  Enc Vitals Group     BP 11/03/18 1350 133/76     Pulse Rate 11/03/18 1350 83     Resp 11/03/18 1350 16     Temp 11/03/18 1350 98.6 F (37 C)     Temp Source 11/03/18 1350 Oral     SpO2 11/03/18 1350 100 %     Weight --      Height --      Head Circumference --      Peak Flow --      Pain Score 11/03/18 1351 6     Pain Loc --      Pain Edu? --  Excl. in GC? --    No data found.  Updated Vital Signs BP 133/76   Pulse 83   Temp 98.6 F (37 C) (Oral)   Resp 16   LMP 11/01/2018 (Exact Date)   SpO2 100%   Visual Acuity Right Eye Distance:   Left Eye Distance:   Bilateral Distance:    Right Eye Near:   Left Eye Near:    Bilateral Near:     Physical Exam Constitutional:      General: She is in acute distress.     Appearance: Normal appearance.  Eyes:     Conjunctiva/sclera: Conjunctivae normal.  Cardiovascular:     Rate and Rhythm: Normal rate and regular rhythm.     Pulses: Normal pulses.     Heart sounds: Normal heart sounds.  Pulmonary:     Effort: Pulmonary effort is normal.     Breath sounds: Normal breath sounds.  Abdominal:     General: Bowel sounds are normal.     Palpations: Abdomen is soft.  Musculoskeletal:        General: Swelling, tenderness and signs of injury present. No deformity.  Skin:    General: Skin is warm.     Capillary Refill: Capillary refill takes less than 2 seconds.     Findings: Bruising present.  Neurological:     General: No focal deficit present.     Mental  Status: She is alert.      UC Treatments / Results  Labs (all labs ordered are listed, but only abnormal results are displayed) Labs Reviewed - No data to display  EKG None  Radiology No results found.  Procedures Procedures (including critical care time)  Medications Ordered in UC Medications - No data to display  Initial Impression / Assessment and Plan / UC Course  I have reviewed the triage vital signs and the nursing notes.  Pertinent labs & imaging results that were available during my care of the patient were reviewed by me and considered in my medical decision making (see chart for details).    1.  Right ankle sprain: X-ray of the right ankle was negative for acute fracture.  X-ray was independently reviewed by me. Patient to use ibuprofen for pain. Cam Walker  Continue icing right ankle Gentle range of motion after about 48 hours of rest Final Clinical Impressions(s) / UC Diagnoses   Final diagnoses:  Sprain of calcaneofibular ligament of right ankle, initial encounter   Discharge Instructions   None    ED Prescriptions    Medication Sig Dispense Auth. Provider   ibuprofen (ADVIL) 600 MG tablet Take 1 tablet (600 mg total) by mouth every 6 (six) hours as needed. 30 tablet Lamptey, Britta MccreedyPhilip O, MD     Controlled Substance Prescriptions [place] Controlled Substance Registry consulted? No   Merrilee JanskyLamptey, Philip O, MD 11/06/18 [phone]
